# Patient Record
Sex: Female | Born: 1990 | Race: White | Hispanic: No | Marital: Single | Smoking: Never smoker
Health system: Southern US, Community
[De-identification: ages and names within clinical notes are randomized; demographics above are authoritative.]

---

## 2018-04-16 ENCOUNTER — Other Ambulatory Visit: Payer: Self-pay

## 2018-04-16 ENCOUNTER — Encounter (HOSPITAL_COMMUNITY): Payer: Self-pay | Admitting: *Deleted

## 2018-04-16 ENCOUNTER — Emergency Department (HOSPITAL_COMMUNITY)
Admission: EM | Admit: 2018-04-16 | Discharge: 2018-04-16 | Disposition: A | Discharge status: Home / Self Care | Payer: BLUE CROSS/BLUE SHIELD | Attending: Emergency Medicine | Admitting: Emergency Medicine

## 2018-04-16 ENCOUNTER — Emergency Department (HOSPITAL_COMMUNITY): Payer: BLUE CROSS/BLUE SHIELD

## 2018-04-16 DIAGNOSIS — K5900 Constipation, unspecified: Secondary | ICD-10-CM | POA: Diagnosis not present

## 2018-04-16 DIAGNOSIS — R1031 Right lower quadrant pain: Secondary | ICD-10-CM | POA: Diagnosis present

## 2018-04-16 DIAGNOSIS — Z79899 Other long term (current) drug therapy: Secondary | ICD-10-CM | POA: Diagnosis not present

## 2018-04-16 DIAGNOSIS — R112 Nausea with vomiting, unspecified: Secondary | ICD-10-CM | POA: Diagnosis not present

## 2018-04-16 DIAGNOSIS — R109 Unspecified abdominal pain: Secondary | ICD-10-CM

## 2018-04-16 LAB — CBC WITH DIFFERENTIAL/PLATELET
Abs Immature Granulocytes: 0.05 10*3/uL (ref 0.00–0.07)
Basophils Absolute: 0 10*3/uL (ref 0.0–0.1)
Basophils Relative: 0 %
Eosinophils Absolute: 0 10*3/uL (ref 0.0–0.5)
Eosinophils Relative: 0 %
HCT: 41.3 % (ref 36.0–46.0)
Hemoglobin: 13.6 g/dL (ref 12.0–15.0)
Immature Granulocytes: 0 %
LYMPHS PCT: 11 %
Lymphs Abs: 1.3 10*3/uL (ref 0.7–4.0)
MCH: 32.3 pg (ref 26.0–34.0)
MCHC: 32.9 g/dL (ref 30.0–36.0)
MCV: 98.1 fL (ref 80.0–100.0)
Monocytes Absolute: 0.6 10*3/uL (ref 0.1–1.0)
Monocytes Relative: 5 %
Neutro Abs: 9.4 10*3/uL — ABNORMAL HIGH (ref 1.7–7.7)
Neutrophils Relative %: 84 %
Platelets: 283 10*3/uL (ref 150–400)
RBC: 4.21 MIL/uL (ref 3.87–5.11)
RDW: 12.3 % (ref 11.5–15.5)
WBC: 11.4 10*3/uL — ABNORMAL HIGH (ref 4.0–10.5)
nRBC: 0 % (ref 0.0–0.2)

## 2018-04-16 LAB — PREGNANCY, URINE: Preg Test, Ur: NEGATIVE

## 2018-04-16 LAB — COMPREHENSIVE METABOLIC PANEL
ALT: 20 U/L (ref 0–44)
AST: 26 U/L (ref 15–41)
Albumin: 4.3 g/dL (ref 3.5–5.0)
Alkaline Phosphatase: 48 U/L (ref 38–126)
Anion gap: 9 (ref 5–15)
BUN: 15 mg/dL (ref 6–20)
CO2: 27 mmol/L (ref 22–32)
Calcium: 9.8 mg/dL (ref 8.9–10.3)
Chloride: 103 mmol/L (ref 98–111)
Creatinine, Ser: 0.9 mg/dL (ref 0.44–1.00)
GFR calc Af Amer: >60 mL/min (ref >60)
GFR calc non Af Amer: >60 mL/min (ref >60)
Glucose, Bld: 107 mg/dL — ABNORMAL HIGH (ref 70–99)
Potassium: 4 mmol/L (ref 3.5–5.1)
SODIUM: 139 mmol/L (ref 135–145)
Total Bilirubin: 0.6 mg/dL (ref 0.3–1.2)
Total Protein: 7.4 g/dL (ref 6.5–8.1)

## 2018-04-16 LAB — URINALYSIS, ROUTINE W REFLEX MICROSCOPIC
Bilirubin Urine: NEGATIVE
GLUCOSE, UA: NEGATIVE mg/dL
Hgb urine dipstick: NEGATIVE
Ketones, ur: NEGATIVE mg/dL
Leukocytes,Ua: NEGATIVE
Nitrite: NEGATIVE
Protein, ur: NEGATIVE mg/dL
Specific Gravity, Urine: 1.009 (ref 1.005–1.030)
pH: 8 (ref 5.0–8.0)

## 2018-04-16 LAB — WET PREP, GENITAL
Clue Cells Wet Prep HPF POC: NONE SEEN
SPERM: NONE SEEN
Trich, Wet Prep: NONE SEEN
Yeast Wet Prep HPF POC: NONE SEEN

## 2018-04-16 LAB — LIPASE, BLOOD: Lipase: 31 U/L (ref 11–51)

## 2018-04-16 MED ORDER — ONDANSETRON 4 MG PO TBDP
4.0000 mg | ORAL_TABLET | Freq: Once | ORAL | Status: AC | PRN
  Administered 2018-04-16: 4 mg via ORAL
  Filled 2018-04-16: qty 1

## 2018-04-16 MED ORDER — SENNOSIDES-DOCUSATE SODIUM 8.6-50 MG PO TABS: 1.0000 | ORAL_TABLET | Freq: Every evening | ORAL | 0 refills | Status: AC | PRN

## 2018-04-16 MED ORDER — ONDANSETRON HCL 4 MG PO TABS: 4.0000 mg | ORAL_TABLET | Freq: Three times a day (TID) | ORAL | 0 refills | Status: AC | PRN

## 2018-04-16 MED ORDER — DICYCLOMINE HCL 20 MG PO TABS: 20.0000 mg | ORAL_TABLET | Freq: Two times a day (BID) | ORAL | 0 refills | Status: AC

## 2018-04-16 NOTE — Discharge Instructions (Signed)
PLEASE RETURN IMMEDIATELY FOR RECURRENT FLANK PAIN, NAUSEA OR VOMITING.  Get help right away if: You have trouble breathing or you are short of breath. Your abdomen hurts or it is swollen or red. You have nausea or vomiting. You feel faint or you pass out. You have blood in your urine.

## 2018-04-16 NOTE — ED Triage Notes (Signed)
Pt c/o right flank pain with nausea since yesterday.  Has been vomiting tonight.  Hx of kidney infection.

## 2018-04-16 NOTE — ED Provider Notes (Signed)
[facility]-EMERGENCY DEPT Provider Note   CSN: [id number] Arrival date & time: 04/16/18  0307     History   Chief Complaint Chief Complaint  Patient presents with  . Flank Pain    right  . Emesis    HPI Gail Fleming is a 28 y.o. female  With a pmh of chronic migraines who presents emergency department chief complaint of flank pain.  The patient states that yesterday she was nauseated all day.  Around 1 AM she awoke with severe pain in her left flank that was sharp.  She then had bilateral flank pain.  She states the pain was then 10 out of 10 and is now a 6 out of 10.  She had about 6 episodes of nonbloody nonbilious vomitus at home followed by 2 episodes here in the emergency department.  She was given Zofran with resolution of her nausea.  She states that this feels the same as a previous episode of pyelonephritis she had about 5 years ago.  She does state that the sharp pain that awoke her from sleep was different from the previous episode.  She had more urinary frequency yesterday.  Last week she did notice some dysuria.  She had some chills without fever.  She denies vaginal symptoms, constipation or diarrhea.    HPI  History reviewed. No pertinent past medical history.  There are no active problems to display for this patient.   History reviewed. No pertinent surgical history.   OB History   No obstetric history on file.      Home Medications    Prior to Admission medications   Medication Sig Start Date End Date Taking? Authorizing Provider  Erenumab-aooe (AIMOVIG Zayante) Inject 1 each into the skin every 30 (thirty) days.   Yes [provider]  Multiple Vitamin (MULTIVITAMIN WITH MINERALS) TABS tablet Take 1 tablet by mouth daily.   Yes [provider]  vitamin B-12 (CYANOCOBALAMIN) 1000 MCG tablet Take 1,000 mcg by mouth daily.   Yes [provider]    Family History No family history on file.  Social  History Social History   Tobacco Use  . Smoking status: Never Smoker  . Smokeless tobacco: Never Used  Substance Use Topics  . Alcohol use: Not Currently  . Drug use: Never     Allergies   Penicillins   Review of Systems Review of Systems  Ten systems reviewed and are negative for acute change, except as noted in the HPI.   Physical Exam Updated Vital Signs BP 113/75 (BP Location: Right Arm)   Pulse 68   Temp 98.8 F (37.1 C) (Oral)   Resp 18   Ht 5\' 3"  (1.6 m)   Wt 64.4 kg   LMP 03/10/2018 (Approximate)   SpO2 99%   BMI 25.15 kg/m   Physical Exam Vitals signs and nursing note reviewed.  Constitutional:      General: She is not in acute distress.    Appearance: She is well-developed. She is not diaphoretic.  HENT:     Head: Normocephalic and atraumatic.  Eyes:     General: No scleral icterus.    Conjunctiva/sclera: Conjunctivae normal.  Neck:     Musculoskeletal: Normal range of motion.  Cardiovascular:     Rate and Rhythm: Normal rate and regular rhythm.     Heart sounds: Normal heart sounds. No murmur. No friction rub. No gallop.   Pulmonary:     Effort: Pulmonary effort is normal. No respiratory  distress.     Breath sounds: Normal breath sounds.  Abdominal:     General: Abdomen is flat. Bowel sounds are normal. There is no distension.     Palpations: Abdomen is soft. There is no mass.     Tenderness: There is no abdominal tenderness. There is right CVA tenderness and left CVA tenderness. There is no guarding or rebound.  Genitourinary:    Comments: Pelvic exam: normal external genitalia, vulva, vagina, cervix, uterus and adnexa.  Skin:    General: Skin is warm and dry.  Neurological:     Mental Status: She is alert and oriented to person, place, and time.  Psychiatric:        Behavior: Behavior normal.      ED Treatments / Results  Labs (all labs ordered are listed, but only abnormal results are displayed) Labs Reviewed  URINALYSIS, ROUTINE  W REFLEX MICROSCOPIC  PREGNANCY, URINE  CBC WITH DIFFERENTIAL/PLATELET  COMPREHENSIVE METABOLIC PANEL  LIPASE, BLOOD    EKG None  Radiology No results found.  Procedures Procedures (including critical care time)  Medications Ordered in ED Medications  ondansetron (ZOFRAN-ODT) disintegrating tablet 4 mg (4 mg Oral Given 04/16/18 0500)     Initial Impression / Assessment and Plan / ED Course  I have reviewed the triage vital signs and the nursing notes.  Pertinent labs & imaging results that were available during my care of the patient were reviewed by me and considered in my medical decision making (see chart for details).    28 year old female with onset of flank pain nausea and vomiting last night.  The differential diagnosis of emergent flank pain includes, but is not limited to :Abdominal aortic aneurysm,, Renal artery embolism,Renal vein thrombosis, Aortic dissection, Mesenteric ischemia, Pyelonephritis, Renal infarction, Renal hemorrhage, Nephrolithiasis/ Renal Colic, Bladder tumor,Cystitis, Biliary colic, Pancreatitis Perforated peptic ulcer Appendicitis ,Inguinal Hernia, Diverticulitis, Bowel obstruction Ectopic Pregnancy,PID/TOA,Ovarian cyst, Ovarian torsion, Shingles, Lower lobe pneumonia, Retroperitoneal hematoma/abscess/tumor, Epidural abscess, Epidural hematoma.  The patient has no current pain at this time.  Urine is negative.  She has a completely benign pelvic examination.  CT scan of the abdomen shows no hydronephrosis perinephric stranding or inflammatory changes around the ovary.  Given the fact that the patient has bilateral flank pain I have very low suspicion that this represents ovarian torsion, pregnancy test is negative so I have no suspicion for ectopic pregnancy.  Patient's abdominal exam is also benign.  I discussed return precautions with the patient and findings of her examination.  Patient will be given Peri-Colace, Bentyl and Zofran.  Discussed gust  outpatient follow-up.  Patient appears appropriate for discharge at this time   Final Clinical Impressions(s) / ED Diagnoses   Final diagnoses:  None    ED Discharge Orders    None       Arthor Captain, PA-C 04/16/18 1016    Gwyneth Sprout, MD 04/17/18 1534

## 2018-04-19 LAB — GC/CHLAMYDIA PROBE AMP (~~LOC~~) NOT AT ARMC
Chlamydia: NEGATIVE
Neisseria Gonorrhea: NEGATIVE

## 2019-08-30 IMAGING — CT CT RENAL STONE PROTOCOL
2 of 4 series · 16 of 46 positions shown, 18 images · non-contrast
Comparison: None.

CLINICAL DATA: Right flank pain, nausea, vomiting, history of
kidney infection

EXAM:
CT ABDOMEN AND PELVIS WITHOUT CONTRAST
TECHNIQUE: Multidetector CT imaging of the abdomen and pelvis was performed
following the standard protocol without IV contrast.

[Series 2: axial st · axial · 0.70mm/px · z∈[-440,-55]mm · 13 of 87 slices shown, 15 images]
[im 5/87  soft-tissue]
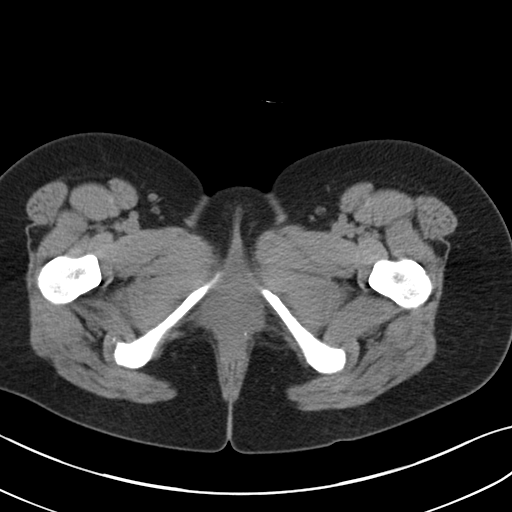
[im 5/87  bone]
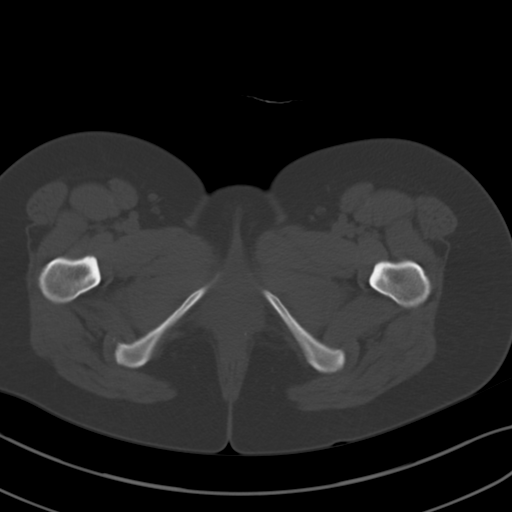
[im 14/87  soft-tissue]
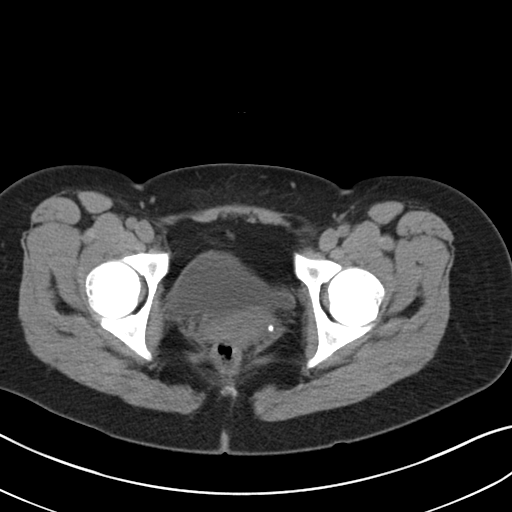
[im 19/87  soft-tissue]
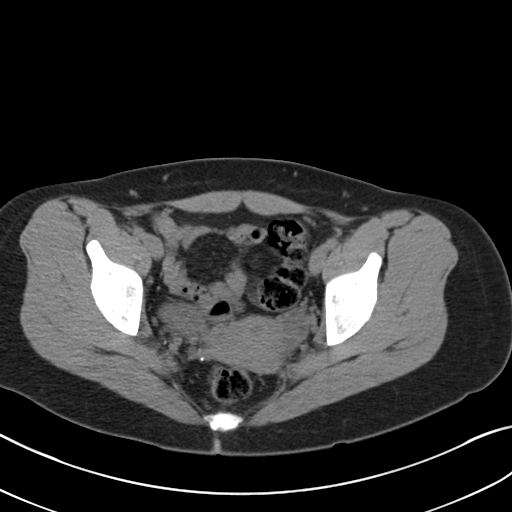
[im 23/87  soft-tissue]
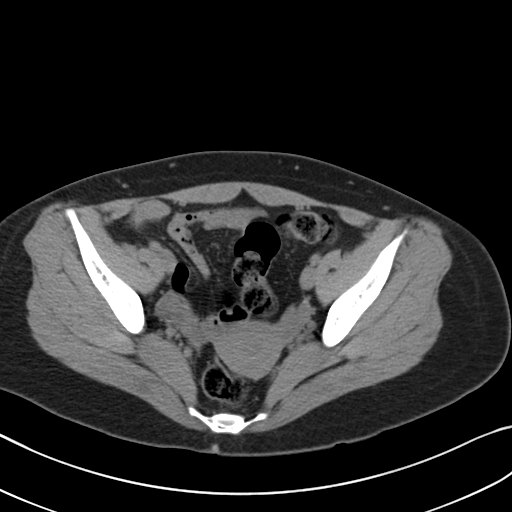
[im 32/87  soft-tissue]
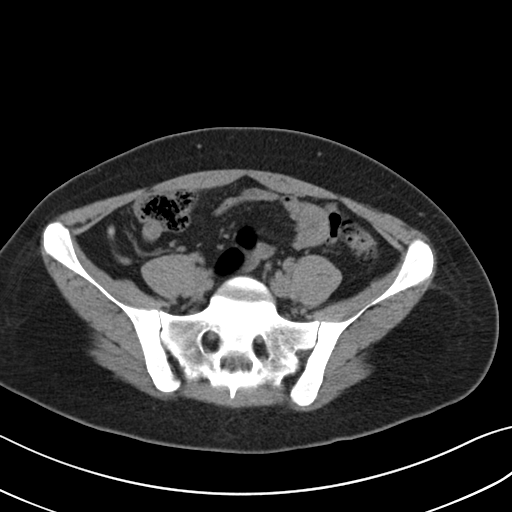
[im 37/87  soft-tissue]
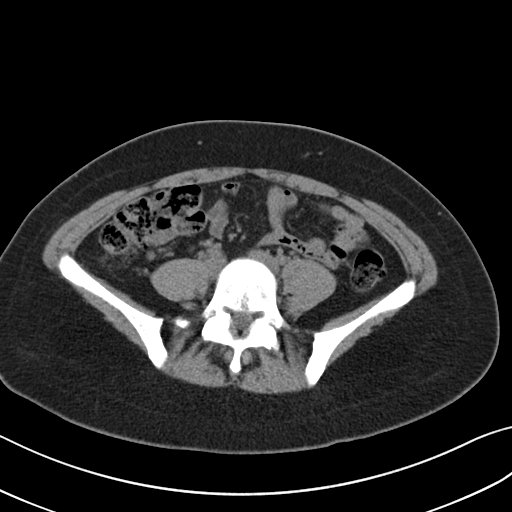
[im 46/87  soft-tissue]
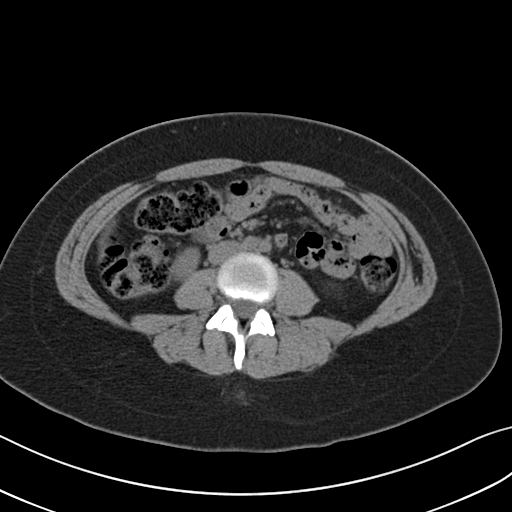
[im 50/87  soft-tissue]
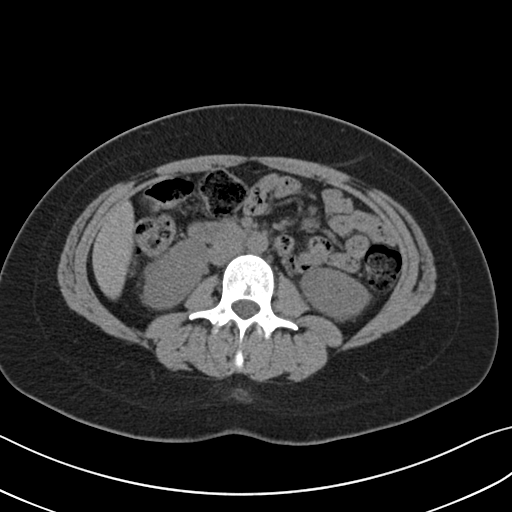
[im 55/87  soft-tissue]
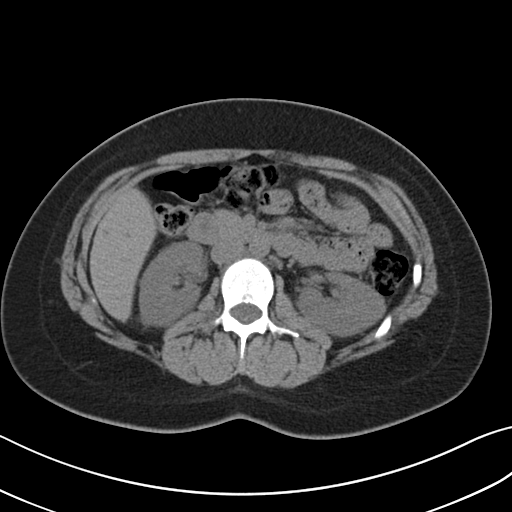
[im 55/87  bone]
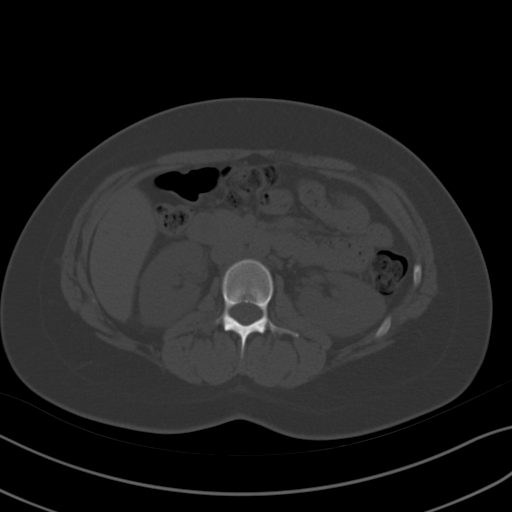
[im 64/87  soft-tissue]
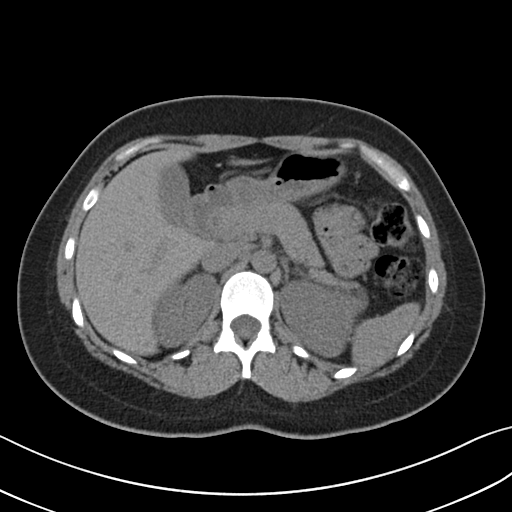
[im 68/87  soft-tissue]
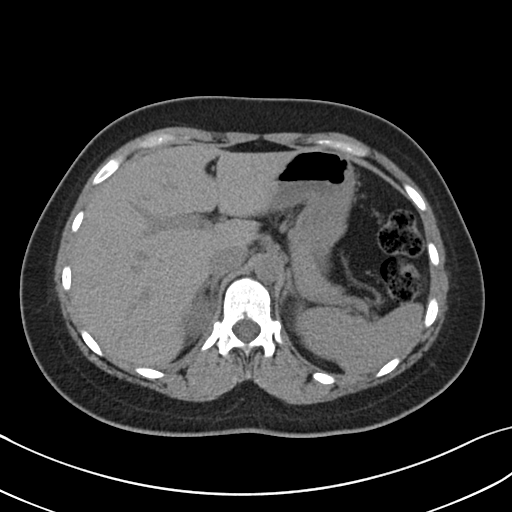
[im 73/87  soft-tissue]
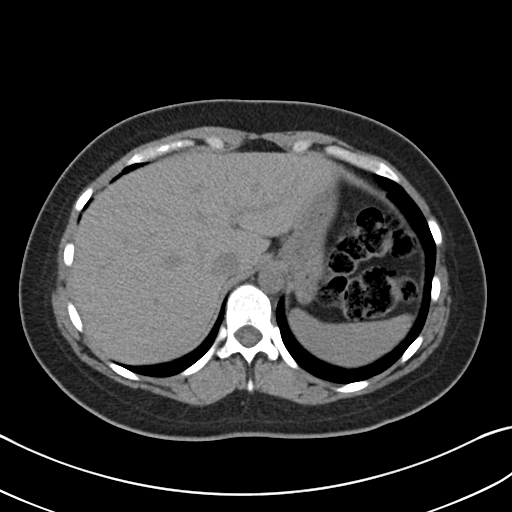
[im 82/87  soft-tissue]
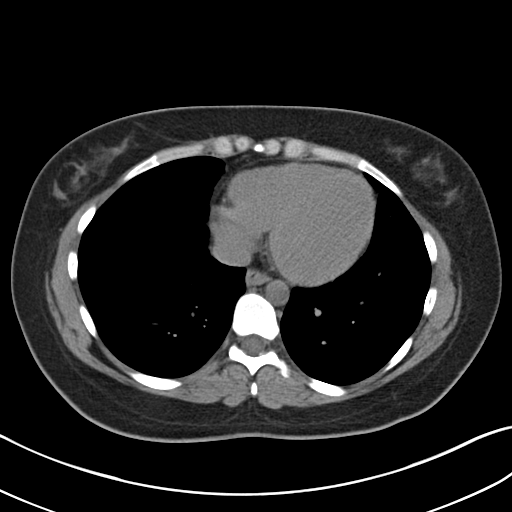

[Series 4: coronal · coronal · 0.70mm/px · 3 of 134 slices shown]
[im 45/134  soft-tissue]
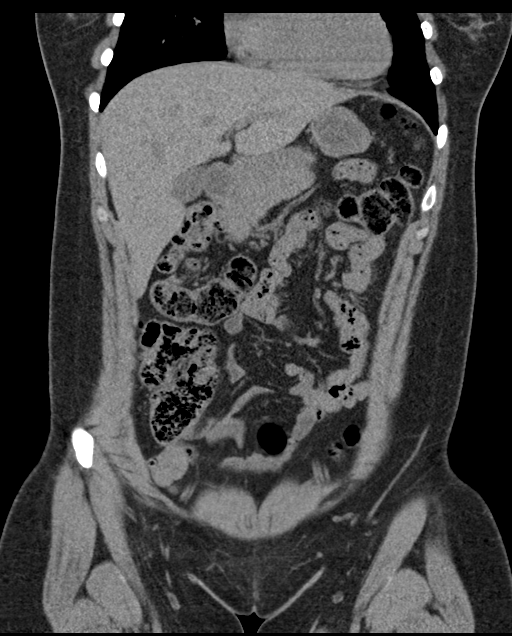
[im 60/134  soft-tissue]
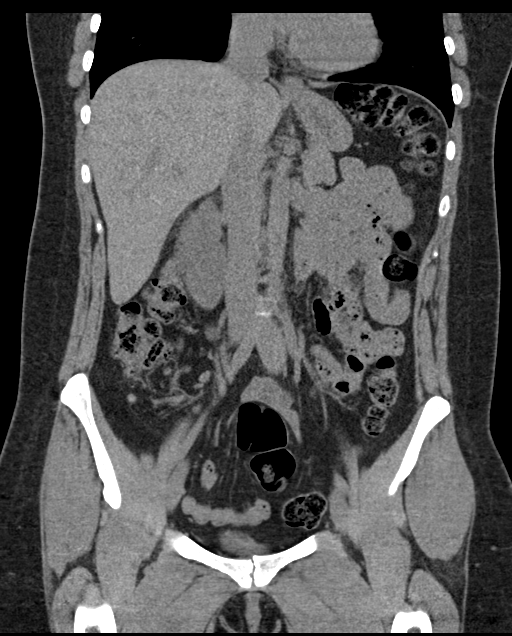
[im 74/134  soft-tissue]
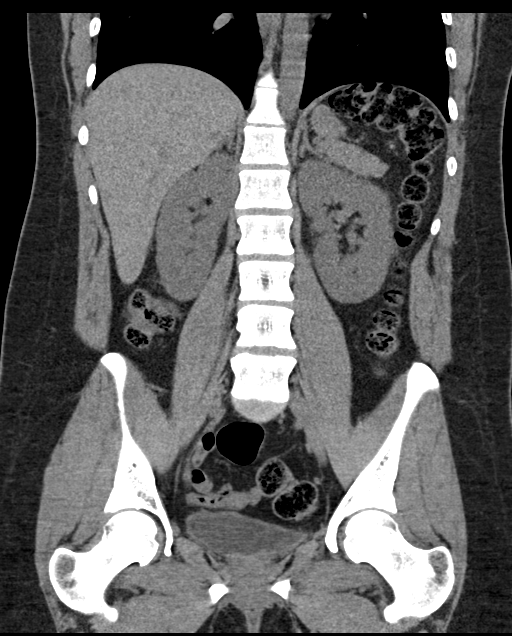

[16 of 46 positions shown; findings below may reference images not displayed]

FINDINGS: Lower chest: No acute abnormality.

Hepatobiliary: No focal liver abnormality is seen. No gallstones,
gallbladder wall thickening, or biliary dilatation.

Pancreas: Unremarkable. No pancreatic ductal dilatation or
surrounding inflammatory changes.

Spleen: Normal in size without focal abnormality.

Adrenals/Urinary Tract: Adrenal glands are unremarkable. Kidneys are
normal, without renal calculi, focal lesion, or hydronephrosis.
Bladder is unremarkable.

Stomach/Bowel: Stomach is within normal limits. Appendix appears
normal. No evidence of bowel wall thickening, distention, or
inflammatory changes. Large burden of stool in the colon.

Vascular/Lymphatic: No significant vascular findings are present. No
enlarged abdominal or pelvic lymph nodes.

Reproductive: No mass or other abnormality.

Other: No abdominal wall hernia or abnormality. No abdominopelvic
ascites.

Musculoskeletal: No acute or significant osseous findings.
IMPRESSION: 1. No acute noncontrast CT findings of the abdomen or pelvis to
explain right flank pain. No evidence of urinary tract calculus or
hydronephrosis. Normal appendix.

2.  Large burden of stool in the colon.

## 2023-04-13 ENCOUNTER — Ambulatory Visit: Payer: Self-pay
